# Patient Record
Sex: Male | Born: 1975 | Race: White | Marital: Single
Health system: Southern US, Community
[De-identification: ages and names within clinical notes are randomized; demographics above are authoritative.]

---

## 2019-06-14 ENCOUNTER — Other Ambulatory Visit: Payer: Self-pay | Admitting: Neurological Surgery

## 2019-06-14 DIAGNOSIS — M5416 Radiculopathy, lumbar region: Secondary | ICD-10-CM

## 2019-06-20 ENCOUNTER — Other Ambulatory Visit: Payer: Self-pay

## 2019-06-26 ENCOUNTER — Other Ambulatory Visit: Payer: Self-pay

## 2019-06-26 ENCOUNTER — Ambulatory Visit
Admission: RE | Admit: 2019-06-26 | Discharge: 2019-06-26 | Disposition: A | Discharge status: Home / Self Care | Payer: Self-pay | Source: Ambulatory Visit | Attending: Neurological Surgery | Admitting: Neurological Surgery

## 2019-06-26 DIAGNOSIS — M5416 Radiculopathy, lumbar region: Secondary | ICD-10-CM

## 2019-06-26 IMAGING — XA DG EPIDURAL NERVE ROOT
2 series · 2 of 2 positions shown · non-contrast
Comparison: none

CLINICAL DATA: Left lower extremity radiculopathy. Displacement of
the L5-S1 disc.

[Series 2: ortho standard · 1 of 1 slices shown (1 of 2)]
[im 1/1]
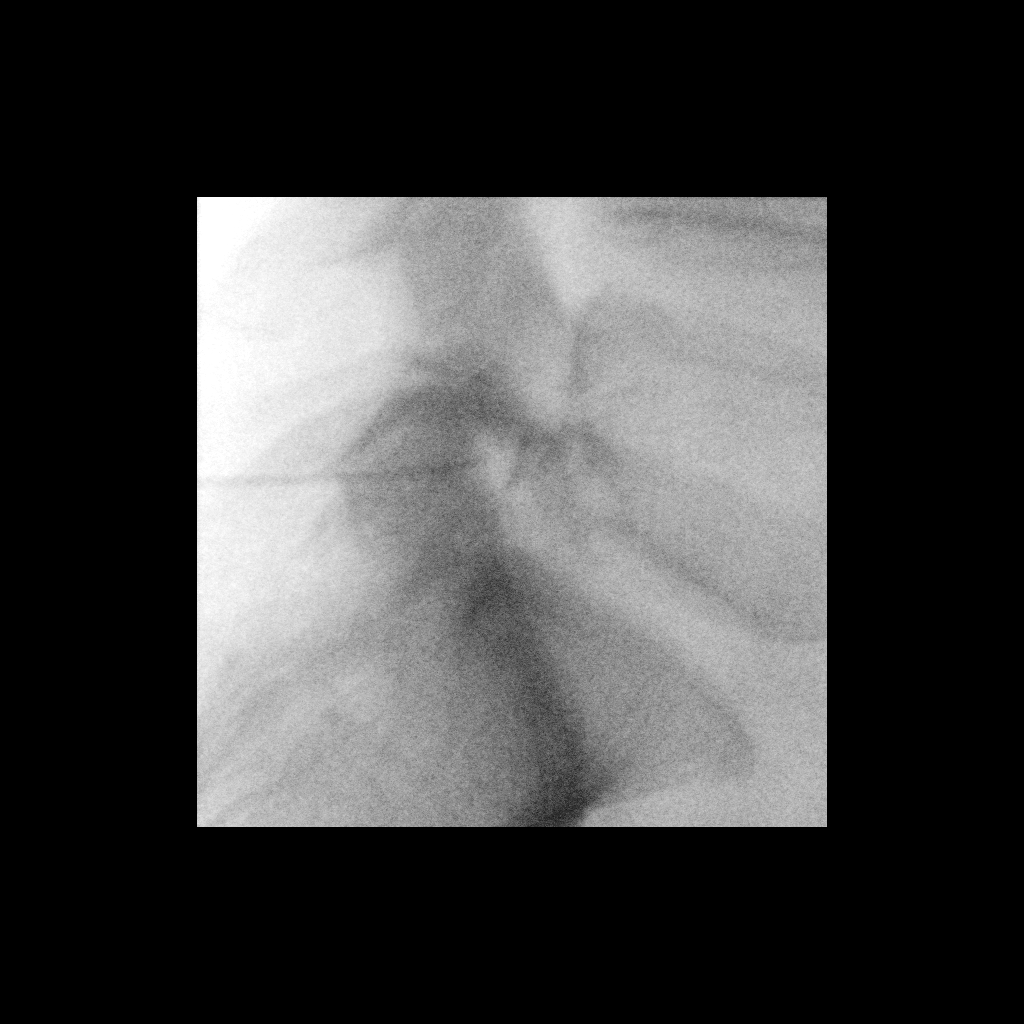

[Series 3: ortho standard · 1 of 1 slices shown (2 of 2)]
[im 1/1]
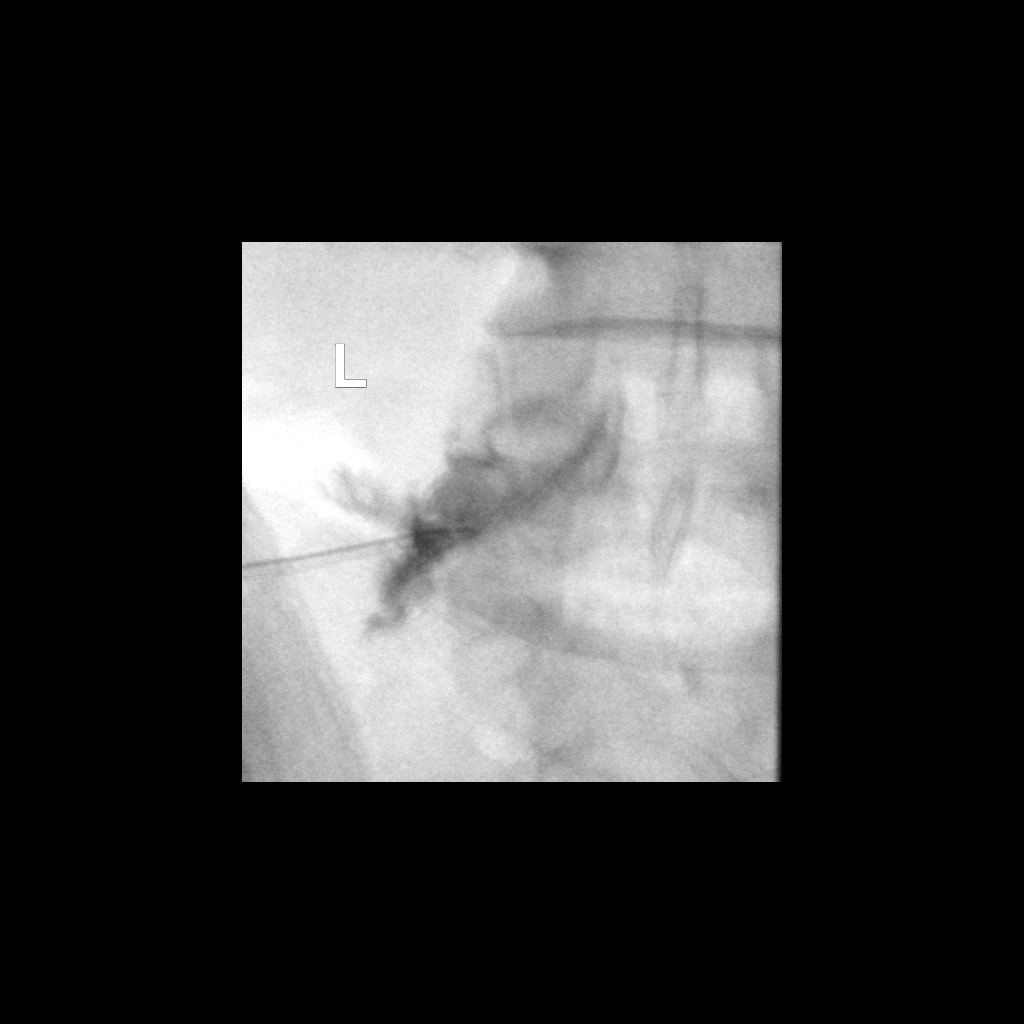

[2 of 2 positions shown; findings below may reference images not displayed]

FLUOROSCOPY TIME:  Radiation Exposure Index (as provided by the
fluoroscopic device): 35.28 uGy*m2

PROCEDURE:
SELECTIVE NERVE ROOT AND TRANSFORAMINAL EPIDURAL STEROID INJECTION:

A transforaminal approach was performed on the left at the L5-S1
foramen. The overlying skin was cleansed and anesthetized. A 22
gauge spinal needle was advanced into the foramen from a lateral
oblique approach. Injection of 2cc of outlined the nerve root and
extended into the epidural space. No vascular or intrathecal spread
is evident.

I then injected 120 mg of Depo-Medrol and 1.5 ml of 1% lidocaine.
The patient tolerated the procedure without evidence for
complication.

The patient was observed for 20 minutes prior to discharge in stable
neurologic condition.
IMPRESSION: Technically successful selective nerve root block and transforaminal
epidural steroid injection on theleft at the L5-S1 foramen.

## 2019-06-26 MED ORDER — METHYLPREDNISOLONE ACETATE 40 MG/ML INJ SUSP (RADIOLOG
120.0000 mg | Freq: Once | INTRAMUSCULAR | Status: AC
  Administered 2019-06-26: 120 mg via EPIDURAL

## 2019-06-26 MED ORDER — IOPAMIDOL (ISOVUE-M 200) INJECTION 41%
1.0000 mL | Freq: Once | INTRAMUSCULAR | Status: AC
  Administered 2019-06-26: 09:00:00 1 mL via EPIDURAL

## 2019-06-26 NOTE — Discharge Instructions (Signed)
